# Patient Record
Sex: Female | Born: 2000 | Race: Black or African American | Hispanic: No | Marital: Single
Health system: Southern US, Community
[De-identification: ages and names within clinical notes are randomized; demographics above are authoritative.]

---
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
## 2021-05-01 ENCOUNTER — Encounter: Payer: Self-pay | Admitting: Family Medicine

## 2022-01-02 ENCOUNTER — Ambulatory Visit: Payer: Medicaid Other
